# Patient Record
Sex: Male | Born: 1950
Health system: Southern US, Community
[De-identification: ages and names within clinical notes are randomized; demographics above are authoritative.]

## PROBLEM LIST (undated history)

## (undated) DIAGNOSIS — N529 Male erectile dysfunction, unspecified: Secondary | ICD-10-CM

## (undated) DIAGNOSIS — N401 Enlarged prostate with lower urinary tract symptoms: Secondary | ICD-10-CM

## (undated) DIAGNOSIS — C439 Malignant melanoma of skin, unspecified: Secondary | ICD-10-CM

## (undated) DIAGNOSIS — B182 Chronic viral hepatitis C: Secondary | ICD-10-CM

## (undated) DIAGNOSIS — Z8679 Personal history of other diseases of the circulatory system: Secondary | ICD-10-CM

## (undated) DIAGNOSIS — R351 Nocturia: Secondary | ICD-10-CM

## (undated) HISTORY — DX: Personal history of other diseases of the circulatory system: Z86.79

## (undated) HISTORY — DX: Male erectile dysfunction, unspecified: N52.9

## (undated) HISTORY — DX: Benign prostatic hyperplasia with lower urinary tract symptoms: N40.1

## (undated) HISTORY — DX: Nocturia: R35.1

## (undated) HISTORY — DX: Chronic viral hepatitis C: B18.2

## (undated) HISTORY — DX: Malignant melanoma of skin, unspecified: C43.9

---

## 2016-02-14 DIAGNOSIS — H401111 Primary open-angle glaucoma, right eye, mild stage: Secondary | ICD-10-CM | POA: Diagnosis not present

## 2016-02-14 DIAGNOSIS — H401133 Primary open-angle glaucoma, bilateral, severe stage: Secondary | ICD-10-CM | POA: Diagnosis not present

## 2016-02-17 ENCOUNTER — Encounter: Payer: Self-pay | Admitting: Internal Medicine

## 2016-02-17 ENCOUNTER — Ambulatory Visit (INDEPENDENT_AMBULATORY_CARE_PROVIDER_SITE_OTHER): Payer: Medicare Other | Admitting: Internal Medicine

## 2016-02-17 VITALS — BP 148/88 | HR 64 | Temp 98.7°F | Ht 71.0 in | Wt 207.1 lb

## 2016-02-17 DIAGNOSIS — C439 Malignant melanoma of skin, unspecified: Secondary | ICD-10-CM | POA: Insufficient documentation

## 2016-02-17 DIAGNOSIS — N401 Enlarged prostate with lower urinary tract symptoms: Secondary | ICD-10-CM | POA: Diagnosis not present

## 2016-02-17 DIAGNOSIS — F1921 Other psychoactive substance dependence, in remission: Secondary | ICD-10-CM | POA: Insufficient documentation

## 2016-02-17 DIAGNOSIS — R350 Frequency of micturition: Secondary | ICD-10-CM | POA: Diagnosis not present

## 2016-02-17 DIAGNOSIS — E785 Hyperlipidemia, unspecified: Secondary | ICD-10-CM

## 2016-02-17 DIAGNOSIS — H409 Unspecified glaucoma: Secondary | ICD-10-CM

## 2016-02-17 DIAGNOSIS — Z85820 Personal history of malignant melanoma of skin: Secondary | ICD-10-CM

## 2016-02-17 DIAGNOSIS — B182 Chronic viral hepatitis C: Secondary | ICD-10-CM

## 2016-02-17 DIAGNOSIS — Z0000 Encounter for general adult medical examination without abnormal findings: Secondary | ICD-10-CM | POA: Insufficient documentation

## 2016-02-17 DIAGNOSIS — N529 Male erectile dysfunction, unspecified: Secondary | ICD-10-CM | POA: Diagnosis not present

## 2016-02-17 MED ORDER — TAMSULOSIN HCL 0.4 MG PO CAPS
0.4000 mg | ORAL_CAPSULE | Freq: Every day | ORAL | Status: DC
Start: 2016-02-17 — End: 2016-06-03

## 2016-02-17 MED ORDER — TAMSULOSIN HCL 0.4 MG PO CAPS
0.4000 mg | ORAL_CAPSULE | Freq: Every day | ORAL | Status: DC
Start: 2016-02-17 — End: 2016-02-17

## 2016-02-17 NOTE — Assessment & Plan Note (Signed)
He underwent excision at [facility] in 2001 and did not receive IFNa. He denies any dark macular skin lesions.

## 2016-02-17 NOTE — Assessment & Plan Note (Signed)
We've checked a lipid panel today. At the next visit, we can offer him colorectal cancer screening.

## 2016-02-17 NOTE — Assessment & Plan Note (Signed)
He appears to have non-organic erectile dysfunction as he still has nocturnal erections. We'll still screen him for diabetes and hypercholesterolemia.  Today I wanted to start tadalafil to treat his BPH and ED, but this won't be generic until 2018 and costs $300 per month right now. At the next visit, we can offer him low-dose sildenafil so long as he is not orthostatic on tamsulosin.

## 2016-02-17 NOTE — Progress Notes (Signed)
Internal Medicine Clinic Attending  Case discussed with Dr. Flores at the time of the visit.  We reviewed the resident's history and exam and pertinent patient test results.  I agree with the assessment, diagnosis, and plan of care documented in the resident's note. 

## 2016-02-17 NOTE — Assessment & Plan Note (Addendum)
He has lower urinary tract symptoms including nocturia, urgency, and post-void dribbling that sound most consistent with benign prostatic hypertrophy. He does not have a family history of prostate cancer but would like to be screened.  Today I'll check a urinalysis and PSA level. I've started tamsulosin 0.4mg  daily. He wanted to start something for his erectile dysfunction as well, but I'm concerned about starting a PDE5 and alpha-1 blockers at the same time given the risk of hypotension.  Addendum: His PSA was reassuringly normal.

## 2016-02-17 NOTE — Assessment & Plan Note (Signed)
He had used IV drugs in the past but quit over 20 years ago so we'll screen him for hepatitis C per above.

## 2016-02-17 NOTE — Addendum Note (Signed)
Addended by: Carlota Raspberry on: 02/17/2016 03:29 PM   Modules accepted: Miquel Dunn

## 2016-02-17 NOTE — Assessment & Plan Note (Addendum)
He was told he had hepatitis C while incarcerated so I'm checking an antibody today; if positive, I'll reflex an RNA level. I've also checked a CMP. He does not clinically have cirrhosis on my exam.  Addendum: Hepatitis C antibody and RNA levels were elevated. I left a voicemail and I'm waiting to hear back. In the meantime I've referred him to infectious disease. He already knows he has hepatitis C. I didn't check an HIV because he told me he was checked last month when he was released from prison and he didn't have it; unfortunately we weren't able to get those records.  He told me his wife is HIV positive so I think another screening antibody is prudent.

## 2016-02-17 NOTE — Progress Notes (Addendum)
Patient ID: Mitchell Griffin, male   DOB: 1951/08/21, 66 y.o.   MRN: [id number] [facility] Subjective:   Patient ID: Mitchell Griffin male   DOB: 1950/11/08 65 y.o.   MRN: [id number]  HPI: Mr.Mitchell Griffin is a 65 y.o. male with a past medical history detailed below who presents to establish care in our clinic and complains of erectile dysfunction and nocturia.  Erectile dysfunction: He has had problem achieving an erection for the last 2-3 years. He still has nocturnal erections and he has a normal sexual desire.  Nocturia: He has been using the bathroom 3-4 times per night and would like to be screened for prostate cancer. There is no history of prostate cancer in his family.  Past Medical History  Diagnosis Date  . Hepatitis C, chronic ([place])   . History of endocarditis     1988 associated with IV drug abuse  . Melanoma ([place])     Excised in 2001 at [facility], not treated with IFNa  . Benign prostatic hypertrophy with nocturia   . Erectile dysfunction    Current Outpatient Prescriptions  Medication Sig Dispense Refill  . COMBIGAN 0.2-0.5 % ophthalmic solution   0  . LUMIGAN 0.01 % SOLN instill 1 drop into both eyes once daily at bedtime  0  . tamsulosin (FLOMAX) 0.4 MG CAPS capsule Take 1 capsule (0.4 mg total) by mouth daily after supper. 90 capsule 0   No current facility-administered medications for this visit.   Family History  Problem Relation Age of Onset  . Breast cancer Mother   . Healthy Father    Social History   Social History  . Marital Status: Married    Spouse Name: N/A  . Number of Children: N/A  . Years of Education: N/A   Social History Main Topics  . Smoking status: Former Smoker -- 0.30 packs/day for 30 years    Types: Cigarettes    Quit date: 02/17/1999  . Smokeless tobacco: None  . Alcohol Use: No  . Drug Use: Yes     Comment: history of IV drug abuse, clean for 20 years  . Sexual Activity:    Partners: Female   Other Topics  Concern  . None   Social History Narrative   He was incarcerated for 23 years and was released in March 2017. He is now retired and lives with his wife.   Review of Systems  Constitutional: Negative for fever, chills, weight loss, malaise/fatigue and diaphoresis.  Eyes: Negative for blurred vision.  Respiratory: Negative for cough and shortness of breath.   Cardiovascular: Negative for chest pain, claudication and leg swelling.  Gastrointestinal: Positive for heartburn. Negative for abdominal pain, diarrhea, blood in stool and melena.  Genitourinary: Positive for frequency. Negative for dysuria, urgency, hematuria and flank pain.  Musculoskeletal: Negative for myalgias, back pain and joint pain.  Skin: Negative for rash.  Neurological: Negative for dizziness, loss of consciousness and headaches.  Psychiatric/Behavioral: Negative for depression and substance abuse. The patient is not nervous/anxious.     Objective:  Physical Exam: Filed Vitals:   02/17/16 0955  BP: 148/88  Pulse: 64  Temp: 98.7 F (37.1 C)  TempSrc: Oral  Height: 5\' 11"  (1.803 m)  Weight: 207 lb 1.6 oz (93.94 kg)  SpO2: 100%   General: friendly black man resting in bed comfortably, appropriately conversational HEENT: no scleral icterus, extra-ocular muscles intact, poor dentition Cardiac: regular rate and rhythm, no rubs, murmurs or gallops Pulm: breathing well, clear  to auscultation bilaterally Abd: bowel sounds normal, soft, nondistended, non-tender Ext: warm and well perfused, without pedal edema Lymph: no cervical or supraclavicular lymphadenopathy Skin: 10x12cm atrophic scar on right middle back from presumptive melanoma excision. No other hyperpigmented macules seen on torso or legs Neuro: alert and oriented X3, cranial nerves II-XII grossly intact, moving all extremities well  Assessment & Plan:  Case discussed with Dr. Dareen Piano  Benign prostatic hypertrophy with urinary frequency He has lower  urinary tract symptoms including nocturia, urgency, and post-void dribbling that sound most consistent with benign prostatic hypertrophy. He does not have a family history of prostate cancer but would like to be screened.  Today I'll check a urinalysis and PSA level. I've started tamsulosin 0.4mg  daily. He wanted to start something for his erectile dysfunction as well, but I'm concerned about starting a PDE5 and alpha-1 blockers at the same time given the risk of hypotension.  Addendum: His PSA was reassuringly normal.  Erectile dysfunction He appears to have non-organic erectile dysfunction as he still has nocturnal erections. We'll still screen him for diabetes and hypercholesterolemia.  Today I wanted to start tadalafil to treat his BPH and ED, but this won't be generic until 2018 and costs $300 per month right now. At the next visit, we can offer him low-dose sildenafil so long as he is not orthostatic on tamsulosin.  Hepatitis C, chronic ([place]) He was told he had hepatitis C while incarcerated so I'm checking an antibody today; if positive, I'll reflex an RNA level. I've also checked a CMP. He does not clinically have cirrhosis on my exam.  Addendum: Hepatitis C antibody and RNA levels were elevated. I left a voicemail and I'm waiting to hear back. In the meantime I've referred him to infectious disease. He already knows he has hepatitis C. I didn't check an HIV because he told me he was checked last month when he was released from prison and he didn't have it; unfortunately we weren't able to get those records.  He told me his wife is HIV positive so I think another screening antibody is prudent.  Melanoma of skin ([place]) He underwent excision at [facility] in 2001 and did not receive IFNa. He denies any dark macular skin lesions.  History of drug dependence/abuse ([place]) He had used IV drugs in the past but quit over 20 years ago so we'll screen him for hepatitis C per above.  Healthcare  maintenance We've checked a lipid panel today. At the next visit, we can offer him colorectal cancer screening.  Hyperlipidemia His LDL was elevated at 170 with an ASCVD risk of 13%. At the next visit, I'd like to start rosuvastatin 20mg  daily.  Glaucoma He is on combigam and lumbigam for glaucoma and sees an Probation officer in [place].    Medications Ordered Meds ordered this encounter  Medications  . LUMIGAN 0.01 % SOLN    Sig: instill 1 drop into both eyes once daily at bedtime    Refill:  0  . COMBIGAN 0.2-0.5 % ophthalmic solution    Sig:     Refill:  0  . DISCONTD: tamsulosin (FLOMAX) 0.4 MG CAPS capsule    Sig: Take 1 capsule (0.4 mg total) by mouth daily after breakfast.    Dispense:  90 capsule    Refill:  0  . tamsulosin (FLOMAX) 0.4 MG CAPS capsule    Sig: Take 1 capsule (0.4 mg total) by mouth daily after supper.    Dispense:  90 capsule  Refill:  0   Other Orders Orders Placed This Encounter  Procedures  . Microscopic Examination  . PSA  . CMP14 + Anion Gap  . CBC with Diff  . Hepatitis C antibody  . Hepatitis B Surface Antigen  . Hepatitis B Surface Antibody  . Hepatitis B core Ab, Total  . Lipid Profile  . Hemoglobin A1c  . Urinalysis, Complete (81001)  . HCV RNA quant  . HCV RNA quant  . Specimen status report  . HCV RNA (International Units)  . Ambulatory referral to Infectious Disease    Referral Priority:  Routine    Referral Type:  Consultation    Referral Reason:  Specialty Services Required    Requested Specialty:  Infectious Diseases    Number of Visits Requested:  1   Follow Up: Return in about 1 month (around 03/19/2016).

## 2016-02-18 DIAGNOSIS — H409 Unspecified glaucoma: Secondary | ICD-10-CM | POA: Insufficient documentation

## 2016-02-18 DIAGNOSIS — E785 Hyperlipidemia, unspecified: Secondary | ICD-10-CM | POA: Insufficient documentation

## 2016-02-18 LAB — URINALYSIS, COMPLETE
BILIRUBIN UA: NEGATIVE
GLUCOSE, UA: NEGATIVE
KETONES UA: NEGATIVE
LEUKOCYTES UA: NEGATIVE
Nitrite, UA: NEGATIVE
Protein, UA: NEGATIVE
RBC, UA: NEGATIVE
SPEC GRAV UA: 1.019 (ref 1.005–1.030)
Urobilinogen, Ur: 0.2 mg/dL (ref 0.2–1.0)
pH, UA: 5.5 (ref 5.0–7.5)

## 2016-02-18 LAB — HEMOGLOBIN A1C
Est. average glucose Bld gHb Est-mCnc: 140 mg/dL
Hgb A1c MFr Bld: 6.5 % — ABNORMAL HIGH (ref 4.8–5.6)

## 2016-02-18 LAB — LIPID PANEL
CHOLESTEROL TOTAL: 240 mg/dL — AB (ref 100–199)
Chol/HDL Ratio: 5 ratio units (ref 0.0–5.0)
HDL: 48 mg/dL (ref >39)
LDL CALC: 170 mg/dL — AB (ref 0–99)
Triglycerides: 110 mg/dL (ref 0–149)
VLDL CHOLESTEROL CAL: 22 mg/dL (ref 5–40)

## 2016-02-18 LAB — MICROSCOPIC EXAMINATION
Casts: NONE SEEN /lpf
Epithelial Cells (non renal): NONE SEEN /hpf (ref 0–10)

## 2016-02-18 LAB — CBC WITH DIFFERENTIAL/PLATELET
BASOS ABS: 0 10*3/uL (ref 0.0–0.2)
Basos: 0 %
EOS (ABSOLUTE): 0.2 10*3/uL (ref 0.0–0.4)
Eos: 2 %
HEMOGLOBIN: 14.2 g/dL (ref 12.6–17.7)
Hematocrit: 43.7 % (ref 37.5–51.0)
IMMATURE GRANS (ABS): 0 10*3/uL (ref 0.0–0.1)
Immature Granulocytes: 0 %
LYMPHS: 31 %
Lymphocytes Absolute: 2.4 10*3/uL (ref 0.7–3.1)
MCH: 26.2 pg — AB (ref 26.6–33.0)
MCHC: 32.5 g/dL (ref 31.5–35.7)
MCV: 81 fL (ref 79–97)
MONOCYTES: 10 %
Monocytes Absolute: 0.8 10*3/uL (ref 0.1–0.9)
NEUTROS PCT: 57 %
Neutrophils Absolute: 4.3 10*3/uL (ref 1.4–7.0)
PLATELETS: 160 10*3/uL (ref 150–379)
RBC: 5.42 x10E6/uL (ref 4.14–5.80)
RDW: 15 % (ref 12.3–15.4)
WBC: 7.7 10*3/uL (ref 3.4–10.8)

## 2016-02-18 LAB — HEPATITIS C ANTIBODY

## 2016-02-18 LAB — CMP14 + ANION GAP
A/G RATIO: 1 — AB (ref 1.2–2.2)
ALK PHOS: 78 IU/L (ref 39–117)
ALT: 42 IU/L (ref 0–44)
AST: 37 IU/L (ref 0–40)
Albumin: 4.3 g/dL (ref 3.6–4.8)
Anion Gap: 19 mmol/L — ABNORMAL HIGH (ref 10.0–18.0)
BILIRUBIN TOTAL: 0.4 mg/dL (ref 0.0–1.2)
BUN/Creatinine Ratio: 11 (ref 10–24)
BUN: 12 mg/dL (ref 8–27)
CHLORIDE: 99 mmol/L (ref 96–106)
CO2: 22 mmol/L (ref 18–29)
Calcium: 9.3 mg/dL (ref 8.6–10.2)
Creatinine, Ser: 1.1 mg/dL (ref 0.76–1.27)
GFR calc Af Amer: 81 mL/min/{1.73_m2} (ref >59)
GFR calc non Af Amer: 70 mL/min/{1.73_m2} (ref >59)
GLUCOSE: 118 mg/dL — AB (ref 65–99)
Globulin, Total: 4.4 g/dL (ref 1.5–4.5)
POTASSIUM: 4.4 mmol/L (ref 3.5–5.2)
Sodium: 140 mmol/L (ref 134–144)
TOTAL PROTEIN: 8.7 g/dL — AB (ref 6.0–8.5)

## 2016-02-18 LAB — HEPATITIS B CORE ANTIBODY, TOTAL: HEP B C TOTAL AB: POSITIVE — AB

## 2016-02-18 LAB — HEPATITIS B SURFACE ANTIGEN: HEP B S AG: NEGATIVE

## 2016-02-18 LAB — PSA: PROSTATE SPECIFIC AG, SERUM: 0.3 ng/mL (ref 0.0–4.0)

## 2016-02-18 LAB — HEPATITIS B SURFACE ANTIBODY,QUALITATIVE: Hep B Surface Ab, Qual: NONREACTIVE

## 2016-02-18 NOTE — Assessment & Plan Note (Addendum)
His LDL was elevated at 170 with an ASCVD risk of 13%. At the next visit, I'd like to start rosuvastatin 20mg  daily.

## 2016-02-18 NOTE — Addendum Note (Signed)
Addended by: Carlota Raspberry on: 02/18/2016 09:40 AM   Modules accepted: Orders, SmartSet

## 2016-02-18 NOTE — Addendum Note (Signed)
Addended by: Carlota Raspberry on: 02/18/2016 02:23 PM   Modules accepted: Miquel Dunn

## 2016-02-18 NOTE — Assessment & Plan Note (Signed)
He is on combigam and lumbigam for glaucoma and sees an Probation officer in [place].

## 2016-02-19 ENCOUNTER — Telehealth: Payer: Self-pay

## 2016-02-19 LAB — SPECIMEN STATUS REPORT

## 2016-02-19 LAB — HCV RNA QUANT

## 2016-02-19 LAB — HCV RNA (INTERNATIONAL UNITS)
HCV LOG10: 7.272 {Log_IU}/mL
HCV RNA (INTERNATIONAL UNITS): 18700000 [IU]/mL

## 2016-02-19 NOTE — Addendum Note (Signed)
Addended by: Carlota Raspberry on: 02/19/2016 01:55 PM   Modules accepted: Orders, SmartSet

## 2016-02-19 NOTE — Telephone Encounter (Signed)
Pt called requesting results of recent lab work.  Unsure if you have seen, and what I should tell patient.    Please advise.

## 2016-02-20 ENCOUNTER — Encounter: Payer: Self-pay | Admitting: Internal Medicine

## 2016-02-20 NOTE — Telephone Encounter (Signed)
Patient returning a call from Dr. Melburn Hake regarding his lab results. Noticed multiple abnormal values. Patient states he will be available all day. Please advise.

## 2016-02-21 NOTE — Telephone Encounter (Signed)
Patient called again- i have read him the letter written and sent by Dr. Melburn Hake on 5/11.  He understands and had no additional questions at the time of our discussion.  He is aware a copy of what we discussed will be in the mail.

## 2016-03-19 ENCOUNTER — Encounter: Payer: Self-pay | Admitting: Internal Medicine

## 2016-03-19 ENCOUNTER — Ambulatory Visit (INDEPENDENT_AMBULATORY_CARE_PROVIDER_SITE_OTHER): Payer: Medicare Other | Admitting: Internal Medicine

## 2016-03-19 VITALS — BP 145/80 | HR 63 | Temp 97.9°F | Ht 71.0 in | Wt 211.3 lb

## 2016-03-19 DIAGNOSIS — N529 Male erectile dysfunction, unspecified: Secondary | ICD-10-CM

## 2016-03-19 DIAGNOSIS — N401 Enlarged prostate with lower urinary tract symptoms: Secondary | ICD-10-CM

## 2016-03-19 DIAGNOSIS — R350 Frequency of micturition: Secondary | ICD-10-CM

## 2016-03-19 DIAGNOSIS — B182 Chronic viral hepatitis C: Secondary | ICD-10-CM

## 2016-03-19 DIAGNOSIS — E785 Hyperlipidemia, unspecified: Secondary | ICD-10-CM | POA: Diagnosis not present

## 2016-03-19 DIAGNOSIS — N528 Other male erectile dysfunction: Secondary | ICD-10-CM

## 2016-03-19 MED ORDER — ATORVASTATIN CALCIUM 20 MG PO TABS: 20.0000 mg | ORAL_TABLET | Freq: Every day | ORAL | Status: DC

## 2016-03-19 NOTE — Patient Instructions (Signed)
Mitchell Griffin,  I'm happy doing well. Keep taking the tamsulosin. The other important thing is to not taking any fluids after 7 PM and to  make sure you urinated before you sleep every night. This should help even more with your symptoms. Finally, I did send in a prescription for cholesterol medicine, atorvastatin 20 mg once daily. This will help prevent heart disease in the future. We will see back in 3 months to check how you are doing!  Thanks, Blane Ohara

## 2016-03-20 NOTE — Assessment & Plan Note (Addendum)
Patient with known history of hepatitis C, most likely from distant history of IVDU and incarceration. Patient without signs or symptoms of cirrhosis currently and with normal liver function by recent CMP. -Patient has appointment with Dr. Linus Salmons next month for discussion of treatment options of hepatitis C -Offered HIV testing today. Patient declines, states that he was tested negative 2 months ago. His spouse is HIV positive, follows with Dr. Megan Salon. I did counsel them on safe sexual practices, that he should be tested for HIV periodically. -Given that he is recently HIV negative and his wife is HIV positive, Mr. Cuartas may be a candidate for PREP (typically Truvada once daily). If this is not discussed at his infectious disease appointment next month, we can address this at next visit

## 2016-03-20 NOTE — Progress Notes (Signed)
Internal Medicine Clinic Attending  Case discussed with Dr. Kennedy at the time of the visit.  We reviewed the resident's history and exam and pertinent patient test results.  I agree with the assessment, diagnosis, and plan of care documented in the resident's note.  

## 2016-03-20 NOTE — Assessment & Plan Note (Signed)
Patient states that since starting tamsulosin at last visit, he is using the restroom less frequently at night, now roughly 1-2 times per night instead of 4 times per night. He is tolerating the medicine well without lightheadedness or dizziness. I also discussed with him about appropriate fluid intake in the evening, which he says he will try to incorporate. PSA was normal at last visit as well -Continue tamsulosin 0.4 mg daily -Instructed patient to not take in fluids after 7 PM and to void before going to bed

## 2016-03-20 NOTE — Assessment & Plan Note (Signed)
After further discussion with Mr. Donez and his wife today, his erectile dysfunction is at least partially due to anxiety about intercourse with his wife who is HIV positive although very well suppressed at this time. He still has nocturnal erections, but his primary complaint is duration. I did offer him low-dose sildenafil today since he is not orthostatic on tamsulosin and is tolerating the medication well, but he does not want medications for his erections at this time, stating he can "do it on his own " and noting the potential side effects of that medication. Patient's A1c is 6.5 but does have hyperlipidemia, so at least some component may be improved by risk factor modification. -Atorvastatin 20 mg -Continue to offer sildenafil if needed -Once tadalafil becomes generic next year, consider initiating as it treats both BPH and ED concomitantly

## 2016-03-20 NOTE — Progress Notes (Signed)
   Patient ID: Mitchell Griffin male   DOB: June 08, 1951 65 y.o.   MRN: [id number]  Subjective:   HPI: Mr.Mitchell Griffin is a 65 y.o. with PMH of chronic HCV, BPH, ED who presents to [facility] today for follow-up of his HCV and BPH. Today, he has no acute complaints or symptoms.  Please see problem-based charting for status of medical issues pertinent to this visit.  Review of Systems: Pertinent items noted in HPI and remainder of comprehensive ROS otherwise negative.  Objective:  Physical Exam: Filed Vitals:   03/19/16 0933  BP: 145/80  Pulse: 63  Temp: 97.9 F (36.6 C)  TempSrc: Oral  Height: 5\' 11"  (1.803 m)  Weight: 211 lb 4.8 oz (95.845 kg)  SpO2: 99%   Gen: Well-appearing, alert and oriented to person, place, and time HEENT: Oropharynx clear without erythema or exudate.  Neck: No cervical LAD, no thyromegaly or nodules, no JVD noted. CV: Normal rate, regular rhythm, no murmurs, rubs, or gallops Pulmonary: Normal effort, CTA bilaterally, no wheezing, rales, or rhonchi Abdominal: Soft, non-tender, non-distended, without rebound, guarding, or masses Extremities: Distal pulses 2+ in upper and lower extremities bilaterally, no tenderness, erythema or edema Skin: No atypical appearing moles. No rashes  Assessment & Plan:  Please see problem-based charting for assessment and plan.  Blane Ohara, MD Resident Physician, PGY-1 Department of Internal Medicine [facility]

## 2016-03-20 NOTE — Assessment & Plan Note (Signed)
LDL last visit was 170 with ASCVD risk of 13%. Discussed with patient plan for initiating statin therapy today, which he is in agreement with. -Start Atorvastatin 20 mg daily (moderate intensity) -If tolerating well, consider increase to 40 mg daily (high-intensity)

## 2016-03-25 ENCOUNTER — Other Ambulatory Visit: Payer: Medicare Other

## 2016-03-25 DIAGNOSIS — B182 Chronic viral hepatitis C: Secondary | ICD-10-CM | POA: Diagnosis not present

## 2016-03-25 DIAGNOSIS — K7400 Hepatic fibrosis, unspecified: Secondary | ICD-10-CM

## 2016-03-25 DIAGNOSIS — I2510 Atherosclerotic heart disease of native coronary artery without angina pectoris: Secondary | ICD-10-CM | POA: Diagnosis not present

## 2016-03-26 LAB — AFP TUMOR MARKER: AFP-Tumor Marker: 8.4 ng/mL — ABNORMAL HIGH (ref <6.1)

## 2016-03-26 LAB — HIV ANTIBODY (ROUTINE TESTING W REFLEX): HIV: NONREACTIVE

## 2016-03-26 LAB — HEPATITIS A ANTIBODY, TOTAL: Hep A Total Ab: REACTIVE — AB

## 2016-03-26 LAB — PROTIME-INR
INR: 1.1
PROTHROMBIN TIME: 11.2 s (ref 9.0–11.5)

## 2016-03-28 LAB — LIVER FIBROSIS, FIBROTEST-ACTITEST
ALT: 35 U/L (ref 9–46)
APOLIPOPROTEIN A1: 128 mg/dL (ref 94–176)
Alpha-2-Macroglobulin: 442 mg/dL — ABNORMAL HIGH (ref 106–279)
BILIRUBIN: 0.5 mg/dL (ref 0.2–1.2)
Fibrosis Score: 0.77
GGT: 122 U/L — ABNORMAL HIGH (ref 3–70)
HAPTOGLOBIN: 248 mg/dL — AB (ref 43–212)
Necroinflammat ACT Score: 0.31
Reference ID: 1551720

## 2016-03-29 LAB — HCV RNA,LIPA RFLX NS5A DRUG RESIST

## 2016-03-31 LAB — HCV RNA NS5A DRUG RESISTANCE

## 2016-04-08 ENCOUNTER — Encounter: Payer: Self-pay | Admitting: Internal Medicine

## 2016-04-08 ENCOUNTER — Ambulatory Visit (INDEPENDENT_AMBULATORY_CARE_PROVIDER_SITE_OTHER): Payer: Medicare Other | Admitting: Internal Medicine

## 2016-04-08 VITALS — BP 133/86 | HR 61 | Temp 97.9°F | Ht 72.0 in | Wt 208.0 lb

## 2016-04-08 DIAGNOSIS — B182 Chronic viral hepatitis C: Secondary | ICD-10-CM | POA: Diagnosis not present

## 2016-04-08 MED ORDER — LEDIPASVIR-SOFOSBUVIR 90-400 MG PO TABS: 1.0000 | ORAL_TABLET | Freq: Every day | ORAL | Status: AC

## 2016-04-08 NOTE — Progress Notes (Signed)
[facility] for Infectious Disease   CC: consideration for treatment for chronic hepatitis C  HPI:  +Mitchell Griffin is a 65 y.o. male who presents for initial evaluation and management of chronic hepatitis C.  Patient tested positive while in jail. Hepatitis C-associated risk factors present are: IV drug abuse (details: more than 20 years ago). Patient denies history of blood transfusion, renal dialysis, sexual contact with person with liver disease. Patient has had other studies performed. Results: hepatitis C RNA by PCR, result: positive. Patient has not had prior treatment for Hepatitis C. Patient does not have a past history of liver disease. Patient does not have a family history of liver disease. Patient does not  have associated signs or symptoms related to liver disease.  Labs reviewed and confirm chronic hepatitis C with a positive viral load.   Records reviewed from PCP.  Wife with HIV and a patient here.       Patient does have documented immunity to Hepatitis A. Patient does have documented immunity to Hepatitis B.    Review of Systems:   Constitutional: negative for fatigue and malaise Cardiovascular: negative for chest pain Musculoskeletal: negative for myalgias and arthralgias All other systems reviewed and are negative      Past Medical History  Diagnosis Date  . Hepatitis C, chronic ([place])   . History of endocarditis     1988 associated with IV drug abuse  . Melanoma ([place])     Excised in 2001 at [facility], not treated with IFNa  . Benign prostatic hypertrophy with nocturia   . Erectile dysfunction     Prior to Admission medications   Medication Sig Start Date End Date Taking? Authorizing Provider  atorvastatin (LIPITOR) 20 MG tablet Take 1 tablet (20 mg total) by mouth daily. 03/19/16   Norval Gable, MD  COMBIGAN 0.2-0.5 % ophthalmic solution  02/05/16   Historical Provider, MD  LUMIGAN 0.01 % SOLN instill 1 drop into both eyes once daily at bedtime 02/04/16    Historical Provider, MD  tamsulosin (FLOMAX) 0.4 MG CAPS capsule Take 1 capsule (0.4 mg total) by mouth daily after supper. 02/17/16   Loleta Chance, MD    No Known Allergies  Social History  Substance Use Topics  . Smoking status: Former Smoker -- 0.30 packs/day for 30 years    Types: Cigarettes    Quit date: 02/17/1999  . Smokeless tobacco: Not on file  . Alcohol Use: No    Family History  Problem Relation Age of Onset  . Breast cancer Mother   . Healthy Father   no cirrhosis   Objective:  Constitutional: in no apparent distress and alert Filed Vitals:   04/08/16 0912  BP: 133/86  Pulse: 61  Temp: 97.9 F (36.6 C)   Eyes: anicteric Cardiovascular: Cor RRR and No murmurs Respiratory: CTA B; normal respiratory effort Gastrointestinal: Bowel sounds are normal, liver is not enlarged, spleen is not enlarged Musculoskeletal: no pedal edema noted Skin: negatives: no rash; no porphyria cutanea tarda Lymphatic: no cervical lymphadenopathy   Laboratory Genotype: No results found for: HCVGENOTYPE HCV viral load: No results found for: HCVQUANT Lab Results  Component Value Date   WBC 7.7 02/17/2016   HCT 43.7 02/17/2016   MCV 81 02/17/2016   PLT 160 02/17/2016    Lab Results  Component Value Date   CREATININE 1.10 02/17/2016   BUN 12 02/17/2016   NA 140 02/17/2016   K 4.4 02/17/2016   CL 99 02/17/2016  CO2 22 02/17/2016    Lab Results  Component Value Date   ALT 35 03/25/2016   AST 37 02/17/2016   ALKPHOS 78 02/17/2016     Labs and history reviewed and show CHILD-PUGH A  5-6 points: Child class A 7-9 points: Child class B 10-15 points: Child class C  Lab Results  Component Value Date   INR 1.1 03/25/2016   BILITOT 0.4 02/17/2016   ALBUMIN 4.3 02/17/2016     Assessment: New Patient with Chronic Hepatitis C genotype 1a, untreated.  I discussed with the patient the lab findings that confirm chronic hepatitis C as well as the natural history and  progression of disease including about 30% of people who develop cirrhosis of the liver if left untreated and once cirrhosis is established there is a 2-7% risk per year of liver cancer and liver failure.  I discussed the importance of treatment and benefits in reducing the risk, even if significant liver fibrosis exists.   Plan: 1) Patient counseled extensively on limiting acetaminophen to no more than 2 grams daily, avoidance of alcohol. 2) Transmission discussed with patient including sexual transmission, sharing razors and toothbrush.   3) Will need referral to gastroenterology if concern for cirrhosis 4) Will need referral for substance abuse counseling: No.; Further work up to include urine drug screen  No. 5) Will prescribe Harvoni for 12 weeks 6) Hepatitis A vaccine No. 7) Hepatitis B vaccine No. 8) Pneumovax vaccine if concern for cirrhosis on elastography.  9) Further work up to include liver staging with elastography; F4 on Fibrosure.   10) will follow up after starting medication.  11) I will discuss the possibility of using PrEP at his next visit.

## 2016-04-08 NOTE — Patient Instructions (Signed)
Date 04/08/2016  Dear Mr Cale, As discussed in the [facility], your hepatitis C therapy will include the following medications:          Harvoni 90mg /400mg  tablet:           Take 1 tablet by mouth once daily   Please note that ALL MEDICATIONS WILL START ON THE SAME DATE for a total of 12 weeks. ---------------------------------------------------------------- Your HCV Treatment Start Date: TBA   Your HCV genotype:  1a    Liver Fibrosis: TBD    ---------------------------------------------------------------- YOUR PHARMACY CONTACT:   [facility] [place] Phone: [phone] Hours: Monday to Friday 7:30 am to 6:00 pm   Please always contact your pharmacy at least 3-4 business days before you run out of medications to ensure your next month's medication is ready or 1 week prior to running out if you receive it by mail.  Remember, each prescription is for 28 days. ---------------------------------------------------------------- GENERAL NOTES REGARDING YOUR HEPATITIS C MEDICATION:  SOFOSBUVIR/LEDIPASVIR (HARVONI): - Harvoni tablet is taken daily with OR without food. - The tablets are orange. - The tablets should be stored at room temperature.  - Acid reducing agents such as H2 blockers (ie. Pepcid (famotidine), Zantac (ranitidine), Tagamet (cimetidine), Axid (nizatidine) and proton pump inhibitors (ie. Prilosec (omeprazole), Protonix (pantoprazole), Nexium (esomeprazole), or Aciphex (rabeprazole)) can decrease effectiveness of Harvoni. Do not take until you have discussed with a health care provider.    -Antacids that contain magnesium and/or aluminum hydroxide (ie. Milk of Magensia, Rolaids, Gaviscon, Maalox, Mylanta, an dArthritis Pain Formula)can reduce absorption of Harvoni, so take them at least 4 hours before or after Harvoni.  -Calcium carbonate (calcium supplements or antacids such as Tums, Caltrate,  Os-Cal)needs to be taken at least 4 hours hours before or after Harvoni.  -St. John's wort or any products that contain St. John's wort like some herbal supplements  Please inform the office prior to starting any of these medications.  - The common side effects associated with Harvoni include:      1. Fatigue      2. Headache      3. Nausea      4. Diarrhea      5. Insomnia  Please note that this only lists the most common side effects and is NOT a comprehensive list of the potential side effects of these medications. For more information, please review the drug information sheets that come with your medication package from the pharmacy.  ---------------------------------------------------------------- GENERAL HELPFUL HINTS ON HCV THERAPY: 1. Stay well-hydrated. 2. Notify the [facility] of any changes in your other over-the-counter/herbal or prescription medications. 3. If you miss a dose of your medication, take the missed dose as soon as you remember. Return to your regular time/dose schedule the next day.  4.  Do not stop taking your medications without first talking with your healthcare provider. 5.  You may take Tylenol (acetaminophen), as long as the dose is less than 2000 mg (OR no more than 4 tablets of the Tylenol Extra Strengths 500mg  tablet) in 24 hours. 6.  You will see our pharmacist-specialist within the first 2 weeks of starting your medication. 7.  You will need to obtain routine labs around week 4 and12 weeks after starting and then 3 to 6 months after finishing Harvoni.    Scharlene Gloss, [place] [facility] [place],  [place]  [id number] [phone]

## 2016-04-09 MED FILL — *HARVONI 90-400 MG TABLET: 90-400 | 28 days supply | Qty: 28 | Fill #0

## 2016-04-10 ENCOUNTER — Encounter: Payer: Self-pay | Admitting: Pharmacy Technician

## 2016-04-23 ENCOUNTER — Ambulatory Visit (HOSPITAL_COMMUNITY)
Admission: RE | Admit: 2016-04-23 | Discharge: 2016-04-23 | Disposition: A | Discharge status: Home / Self Care | Payer: Medicare Other | Source: Ambulatory Visit | Attending: Internal Medicine | Admitting: Internal Medicine

## 2016-04-23 DIAGNOSIS — B182 Chronic viral hepatitis C: Secondary | ICD-10-CM | POA: Insufficient documentation

## 2016-04-23 DIAGNOSIS — B1920 Unspecified viral hepatitis C without hepatic coma: Secondary | ICD-10-CM | POA: Diagnosis not present

## 2016-04-27 ENCOUNTER — Other Ambulatory Visit: Payer: Medicare Other

## 2016-04-27 ENCOUNTER — Ambulatory Visit: Payer: Medicare Other | Admitting: Pharmacist

## 2016-04-27 ENCOUNTER — Other Ambulatory Visit: Payer: Self-pay | Admitting: Pharmacist

## 2016-04-27 DIAGNOSIS — B182 Chronic viral hepatitis C: Secondary | ICD-10-CM

## 2016-04-27 MED ORDER — ATORVASTATIN CALCIUM 10 MG PO TABS: 10.0000 mg | ORAL_TABLET | Freq: Every day | ORAL | Status: DC

## 2016-04-27 MED FILL — ATORVASTATIN 10 MG TABLET: 10 | 30 days supply | Qty: 30 | Fill #0

## 2016-04-27 NOTE — Progress Notes (Signed)
HPI: Mitchell Griffin is a 65 y.o. male who presents to the [place] [facility] today for follow up of his Hep C treatment.  He has genotype 1a and started Harvoni on June 30.   No results found for: HCVGENOTYPE, HEPCGENOTYPE  Allergies: No Known Allergies  Vitals:    Past Medical History: Past Medical History  Diagnosis Date  . Hepatitis C, chronic ([place])   . History of endocarditis     1988 associated with IV drug abuse  . Melanoma ([place])     Excised in 2001 at [facility], not treated with IFNa  . Benign prostatic hypertrophy with nocturia   . Erectile dysfunction     Social History: Social History   Social History  . Marital Status: Married    Spouse Name: N/A  . Number of Children: N/A  . Years of Education: N/A   Social History Main Topics  . Smoking status: Former Smoker -- 0.30 packs/day for 30 years    Types: Cigarettes    Quit date: 02/17/1999  . Smokeless tobacco: Not on file  . Alcohol Use: No  . Drug Use: Yes     Comment: history of IV drug abuse, clean for 20 years  . Sexual Activity:    Partners: Female   Other Topics Concern  . Not on file   Social History Narrative   He was incarcerated for 23 years and was released in March 2017. He is now retired and lives with his wife.    Labs: HEPATITIS B SURFACE AG (no units)  Date Value  02/17/2016 Negative    No results found for: HCVGENOTYPE, HEPCGENOTYPE  No flowsheet data found.  AST (IU/L)  Date Value  02/17/2016 37   ALT  Date Value  03/25/2016 35 U/L  02/17/2016 42 IU/L   INR (no units)  Date Value  03/25/2016 1.1    CrCl: CrCl cannot be calculated (Unknown ideal weight.).  Fibrosis Score: F4 as assessed by fibrosure   Previous Treatment Regimen: None  Assessment: [name] is here for follow up of his Hep C.  He is doing well so far on Harvoni.  He is not having any side effects - no nausea, headache, diarrhea, etc.  He is tolerating it well.  He has not missed any doses and states  he takes it every morning around 7am. I noticed he had atorvastatin 20 mg prescribed from his PCP. He states he did not know he was supposed to be taking something for his cholesterol and has never picked up any such medication.  Notes from his PCP visit in June state he was supposed to start it around that time. His cholesterol is 240, so it is likely he dose need to start a statin. There is a drug interaction with atorvastatin and Harvoni, so I decreased his dose to 10 mg daily and sent it to [facility].  We will get a Hep C VL today, and he will follow up with Dr. Linus Salmons in October.  Plans: - Continue taking Harvoni 90-400 mg PO once daily - Decrease atorvastatin dose to 10 mg daily while on Harvoni and send to [facility] - Labs today - Follow up appointment with Dr. Linus Salmons 10/10 at 9:30am  Charmaine Downs, PharmD [facility] 04/27/2016, 9:08 AM

## 2016-04-28 LAB — HEPATITIS C RNA QUANTITATIVE
HCV Quantitative Log: <1.18 {Log} (ref <1.18)
HCV Quantitative: <15 IU/mL (ref <15)

## 2016-05-04 MED FILL — *HARVONI 90-400 MG TABLET: 90-400 | 28 days supply | Qty: 28 | Fill #1

## 2016-06-01 MED FILL — *HARVONI 90-400 MG TABLET: 90-400 | 28 days supply | Qty: 28 | Fill #2

## 2016-06-03 ENCOUNTER — Other Ambulatory Visit: Payer: Self-pay | Admitting: Internal Medicine

## 2016-06-03 DIAGNOSIS — R350 Frequency of micturition: Principal | ICD-10-CM

## 2016-06-03 DIAGNOSIS — N401 Enlarged prostate with lower urinary tract symptoms: Secondary | ICD-10-CM

## 2016-06-03 MED FILL — ATORVASTATIN 10 MG TABLET: 10 | 30 days supply | Qty: 30 | Fill #1 | Status: TO

## 2016-07-06 ENCOUNTER — Other Ambulatory Visit: Payer: Medicare Other

## 2016-07-06 ENCOUNTER — Telehealth: Payer: Self-pay | Admitting: Internal Medicine

## 2016-07-06 NOTE — Telephone Encounter (Signed)
T. REMINDER CALL, NO ANSWER,

## 2016-07-07 ENCOUNTER — Encounter: Payer: Medicare Other | Admitting: Internal Medicine

## 2016-07-07 ENCOUNTER — Encounter: Payer: Self-pay | Admitting: Internal Medicine

## 2016-07-21 ENCOUNTER — Ambulatory Visit: Payer: Medicare Other | Admitting: Internal Medicine

## 2016-07-21 ENCOUNTER — Telehealth: Payer: Self-pay | Admitting: *Deleted

## 2016-07-21 NOTE — Telephone Encounter (Signed)
Patient has moved to [place], [place].  Not returning to [place], [place].

## 2016-08-31 ENCOUNTER — Other Ambulatory Visit: Payer: Self-pay | Admitting: Internal Medicine

## 2017-02-05 ENCOUNTER — Other Ambulatory Visit: Payer: Self-pay | Admitting: Internal Medicine

## 2018-04-20 IMAGING — US US ABDOMEN COMPLETE W/ ELASTOGRAPHY
1 series · 13 of 14 positions shown · non-contrast
Comparison: None.

CLINICAL DATA: Chronic hepatitis-C.



[Series 1: us abdomen complete w/ elastography · 0.19mm/px · 13 of 14 slices shown]
[im 1/14]
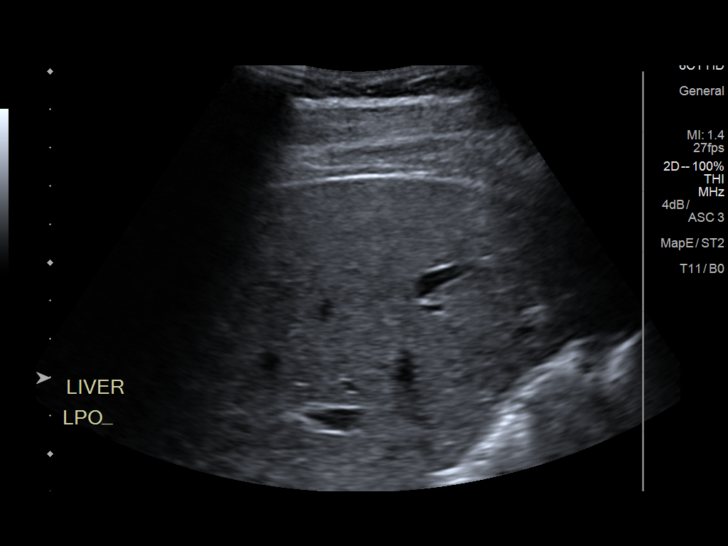
[im 2/14]
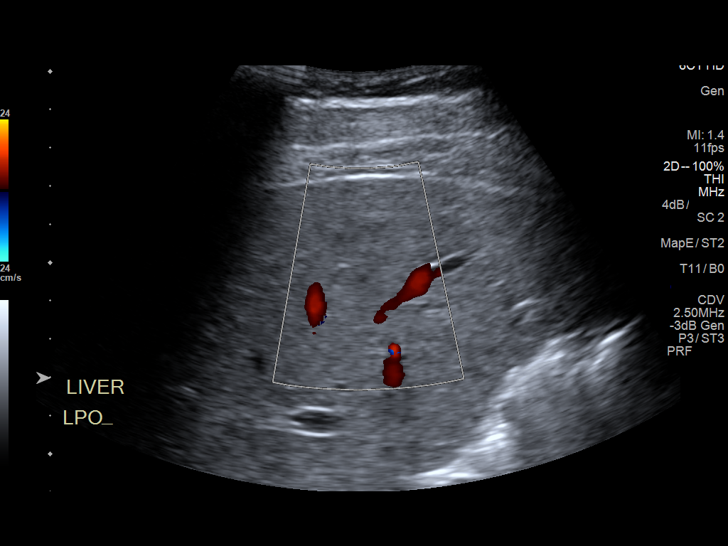
[im 3/14]
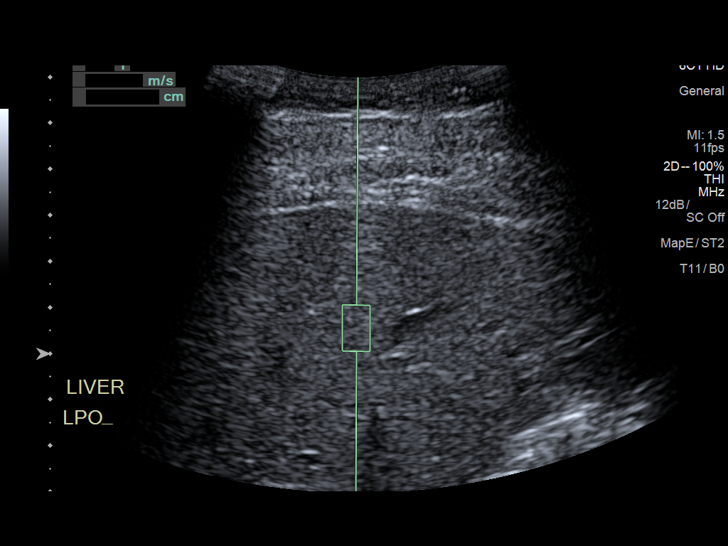
[im 4/14]
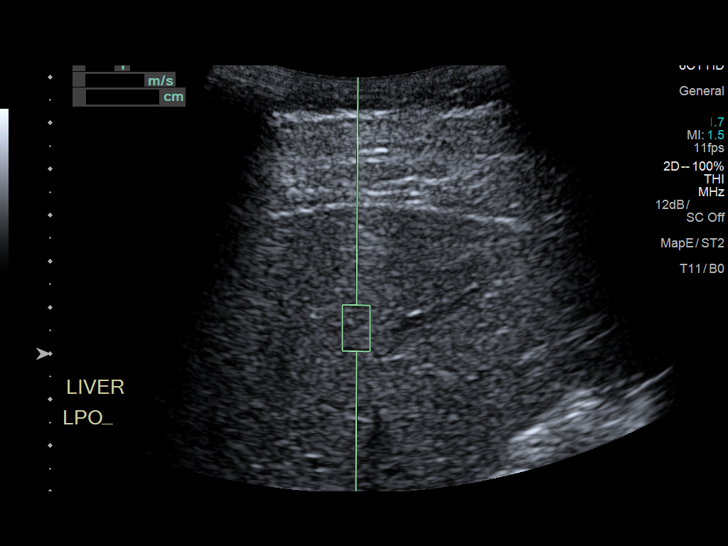
[im 5/14]
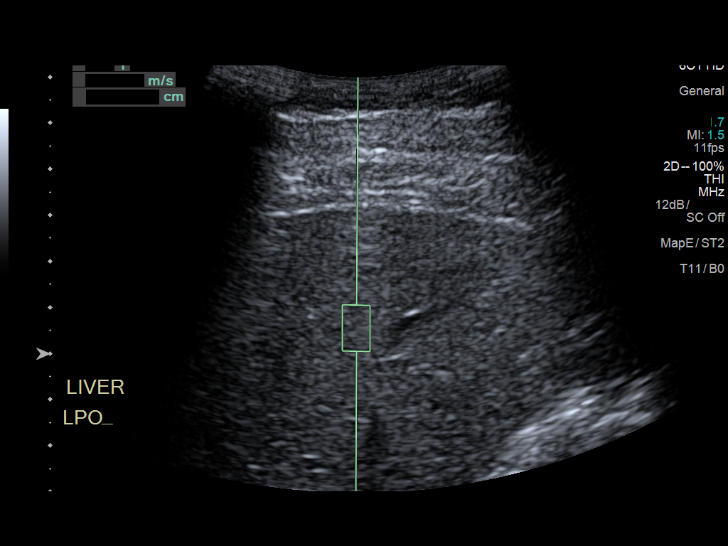
[im 6/14]
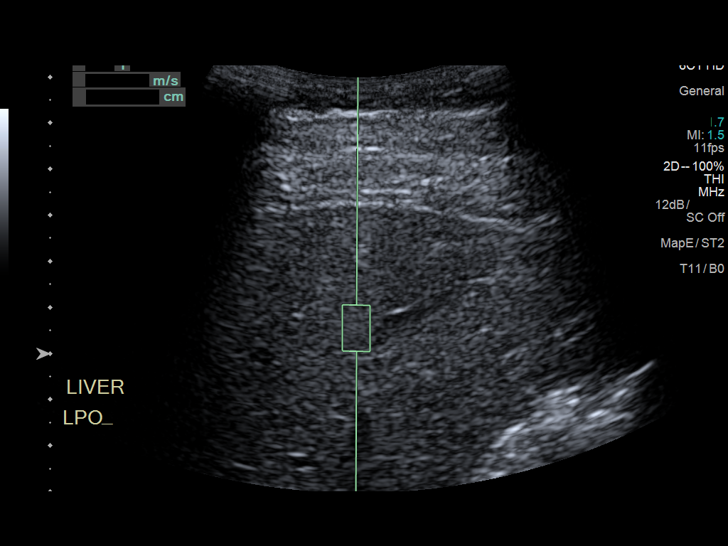
[im 8/14]
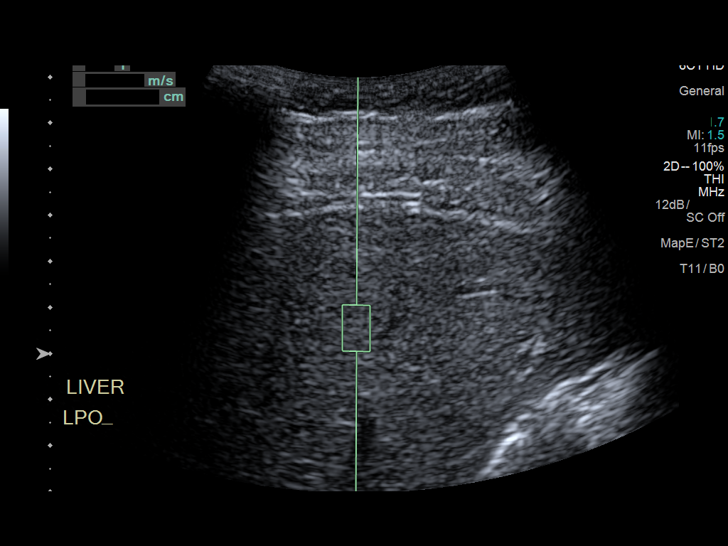
[im 9/14]
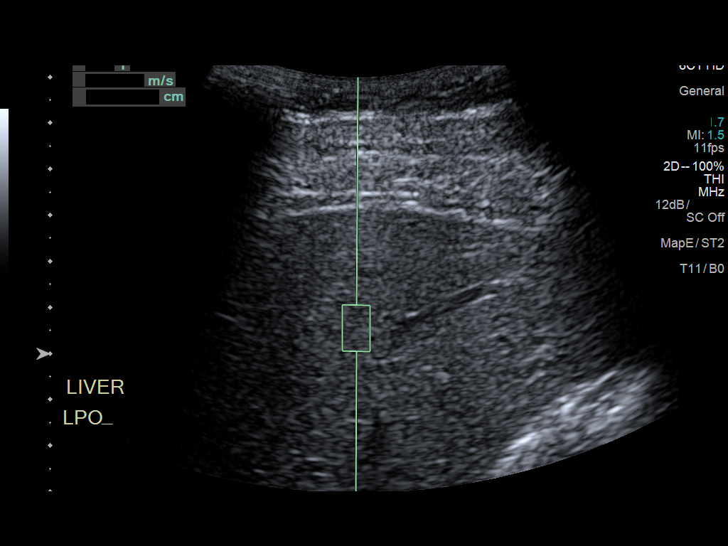
[im 10/14]
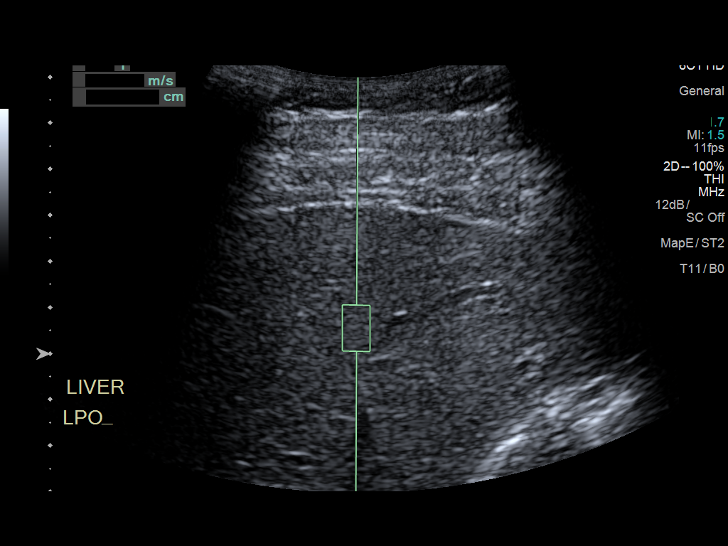
[im 11/14]
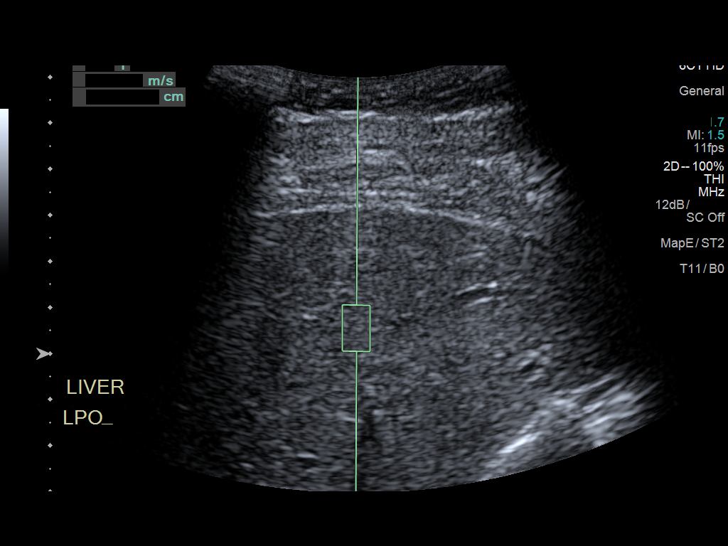
[im 12/14]
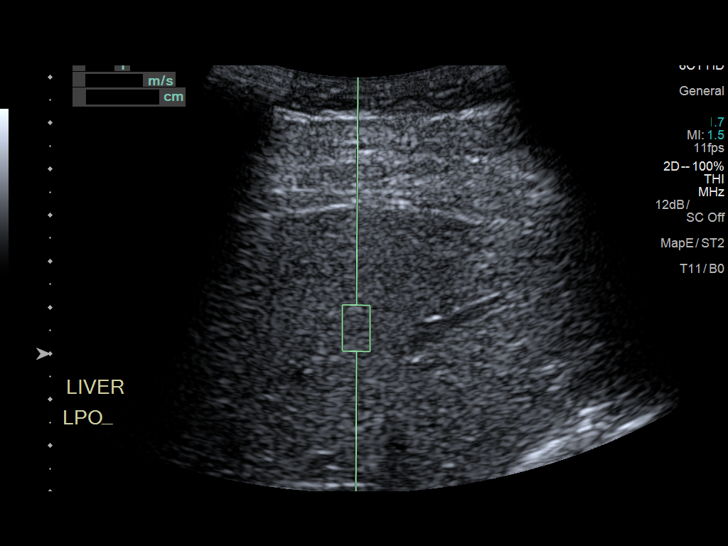
[im 13/14]
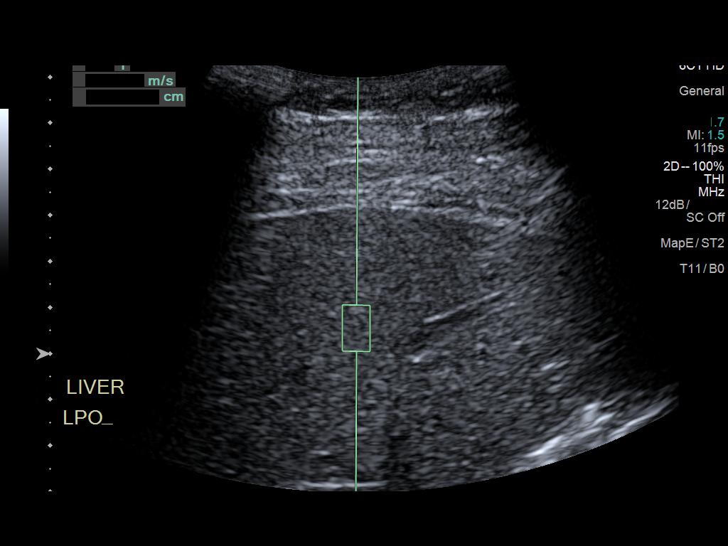
[im 14/14]
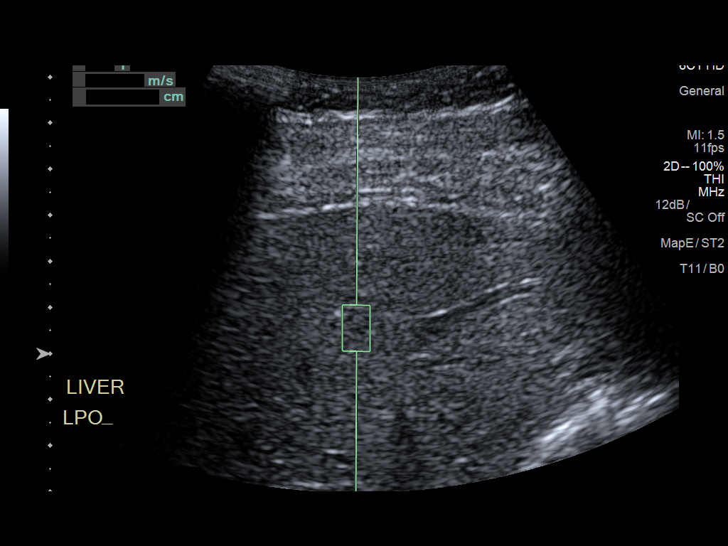

[13 of 14 positions shown; findings below may reference images not displayed]

FINDINGS: ULTRASOUND ABDOMEN

Gallbladder: No gallstones or wall thickening visualized. No
sonographic Murphy sign noted by sonographer.

Common bile duct: Diameter: 5 mm

Liver: No focal lesion identified. Within normal limits in
parenchymal echogenicity.

IVC: No abnormality visualized.

Pancreas: Visualized portion unremarkable.

Spleen: Size and appearance within normal limits.

Right Kidney: Length: 10.5 cm. Echogenicity within normal limits. No
mass or hydronephrosis visualized.

Left Kidney: Length: 12.0 cm. Echogenicity within normal limits. No
mass or hydronephrosis visualized.

Abdominal aorta: No aneurysm visualized.

Other findings: None.

ULTRASOUND HEPATIC ELASTOGRAPHY

Device: Siemens Helix VTQ

Patient position:  Oblique

Transducer 6C1

Number of measurements:  10

Hepatic Segment:  8

Median velocity:   2.84  m/sec

IQR:

IQR/Median velocity ratio

Corresponding Metavir fibrosis score:  Some F3 + F4

Risk of fibrosis: High

Limitations of exam: None

Pertinent findings noted on other imaging exams:  None

Please note that abnormal shear wave velocities may also be
identified in clinical settings other than with hepatic fibrosis,
such as: acute hepatitis, elevated right heart and central venous
pressures including use of beta blockers, Maykel disease
([place]), infiltrative processes such as
mastocytosis/amyloidosis/infiltrative tumor, extrahepatic
cholestasis, in the post-prandial state, and liver transplantation.
Correlation with patient history, laboratory data, and clinical
condition recommended.
IMPRESSION: 1. Normal abdominal sonogram. No macroscopic evidence of cirrhosis.
No liver mass. No secondary findings of portal hypertension.
2. Hepatic elastography results:

Median hepatic shear wave velocity is calculated at 2.84 m/sec.

Corresponding Metavir fibrosis score is Some F3 + F4.

Risk of fibrosis is high.

Follow-up:  Followup Advised
